# Patient Record
Sex: Male | Born: 1970 | Race: White | Hispanic: No | Marital: Single
Health system: Southern US, Community
[De-identification: ages and names within clinical notes are randomized; demographics above are authoritative.]

---

## 2001-10-13 ENCOUNTER — Encounter: Payer: Self-pay | Admitting: Oral Surgery

## 2001-10-13 ENCOUNTER — Inpatient Hospital Stay (HOSPITAL_COMMUNITY): Admission: EM | Admit: 2001-10-13 | Discharge: 2001-10-14 | Payer: Self-pay | Admitting: *Deleted

## 2007-09-17 ENCOUNTER — Ambulatory Visit (HOSPITAL_COMMUNITY): Admission: RE | Admit: 2007-09-17 | Discharge: 2007-09-17 | Payer: Self-pay | Admitting: Urology
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
## 2017-12-21 ENCOUNTER — Other Ambulatory Visit: Payer: Self-pay

## 2017-12-21 NOTE — Patient Outreach (Signed)
This encounter was created in error - please disregard.
